# Patient Record
Sex: Male | Born: 1975
Health system: Southern US, Community
[De-identification: ages and names within clinical notes are randomized; demographics above are authoritative.]

## PROBLEM LIST (undated history)

## (undated) DIAGNOSIS — T7840XA Allergy, unspecified, initial encounter: Secondary | ICD-10-CM

## (undated) HISTORY — DX: Allergy, unspecified, initial encounter: T78.40XA

---

## 2015-08-09 ENCOUNTER — Ambulatory Visit (INDEPENDENT_AMBULATORY_CARE_PROVIDER_SITE_OTHER): Payer: 59 | Admitting: Family Medicine

## 2015-08-09 VITALS — BP 140/86 | HR 114 | Temp 98.2°F | Resp 20 | Ht 69.0 in | Wt 188.8 lb

## 2015-08-09 DIAGNOSIS — L723 Sebaceous cyst: Secondary | ICD-10-CM

## 2015-08-09 MED ORDER — DOXYCYCLINE HYCLATE 100 MG PO TABS: 100.0000 mg | ORAL_TABLET | Freq: Two times a day (BID) | ORAL | Status: DC

## 2015-08-09 NOTE — Patient Instructions (Addendum)
WOUND CARE Please return in 10 days to have your stitches/staples removed or sooner if you have concerns. Marland Kitchen Keep area clean and dry for 24 hours. Do not remove bandage, if applied. . After 24 hours, remove bandage and wash wound gently with mild soap and warm water. Reapply a new bandage after cleaning wound, if directed. . Continue daily cleansing with soap and water until stitches/staples are removed. . Do not apply any ointments or creams to the wound while stitches/staples are in place, as this may cause delayed healing. . Notify the office if you experience any of the following signs of infection: Swelling, redness, pus drainage, streaking, fever >101.0 F . Notify the office if you experience excessive bleeding that does not stop after 15-20 minutes of constant, firm pressure.    Epidermal Cyst An epidermal cyst is sometimes called a sebaceous cyst, epidermal inclusion cyst, or infundibular cyst. These cysts usually contain a substance that looks "pasty" or "cheesy" and may have a bad smell. This substance is a protein called keratin. Epidermal cysts are usually found on the face, neck, or trunk. They may also occur in the vaginal area or other parts of the genitalia of both men and women. Epidermal cysts are usually small, painless, slow-growing bumps or lumps that move freely under the skin. It is important not to try to pop them. This may cause an infection and lead to tenderness and swelling. CAUSES  Epidermal cysts may be caused by a deep penetrating injury to the skin or a plugged hair follicle, often associated with acne. SYMPTOMS  Epidermal cysts can become inflamed and cause:  Redness.  Tenderness.  Increased temperature of the skin over the bumps or lumps.  Grayish-white, bad smelling material that drains from the bump or lump. DIAGNOSIS  Epidermal cysts are easily diagnosed by your caregiver during an exam. Rarely, a tissue sample (biopsy) may be taken to rule out  other conditions that may resemble epidermal cysts. TREATMENT   Epidermal cysts often get better and disappear on their own. They are rarely ever cancerous.  If a cyst becomes infected, it may become inflamed and tender. This may require opening and draining the cyst. Treatment with antibiotics may be necessary. When the infection is gone, the cyst may be removed with minor surgery.  Small, inflamed cysts can often be treated with antibiotics or by injecting steroid medicines.  Sometimes, epidermal cysts become large and bothersome. If this happens, surgical removal in your caregiver's office may be necessary. HOME CARE INSTRUCTIONS  Only take over-the-counter or prescription medicines as directed by your caregiver.  Take your antibiotics as directed. Finish them even if you start to feel better. SEEK MEDICAL CARE IF:   Your cyst becomes tender, red, or swollen.  Your condition is not improving or is getting worse.  You have any other questions or concerns. MAKE SURE YOU:  Understand these instructions.  Will watch your condition.  Will get help right away if you are not doing well or get worse.   This information is not intended to replace advice given to you by your health care provider. Make sure you discuss any questions you have with your health care provider.   Document Released: 06/11/2004 Document Revised: 10/03/2011 Document Reviewed: 01/17/2011 Elsevier Interactive Patient Education 2016 Elsevier Inc.   Sebaceous Cyst Removal Sebaceous cyst removal is a procedure to remove a sac of oily material that forms under your skin (sebaceous cyst). Sebaceous cysts may also be called epidermoid cysts or keratin  cysts. Normally, the skin secretes this oily material through a gland or a hair follicle. This type of cyst usually results when a skin gland or hair follicle becomes blocked. You may need this procedure if you have a sebaceous cyst that becomes large, uncomfortable, or  infected. LET [facility] KNOW ABOUT:  Any allergies you have.  All medicines you are taking, including vitamins, herbs, eye drops, creams, and over-the-counter medicines.  Previous problems you or members of your family have had with the use of anesthetics.  Any blood disorders you have.  Previous surgeries you have had.  Medical conditions you have. RISKS AND COMPLICATIONS Generally, this is a safe procedure. However, problems may occur, including:  Developing another cyst.  Bleeding.  Infection.  Scarring. BEFORE THE PROCEDURE  Ask your health care provider about:  Changing or stopping your regular medicines. This is especially important if you are taking diabetes medicines or blood thinners.  Taking medicines such as aspirin and ibuprofen. These medicines can thin your blood. Do not take these medicines before your procedure if your health care provider instructs you not to.  If you have an infected cyst, you may have to take antibiotic medicines before or after the cyst removal. Take your antibiotics as directed by your health care provider. Finish all of the medicine even if you start to feel better.  Take a shower on the morning of your procedure. Your health care provider may ask you to use a germ-killing (antiseptic) soap. PROCEDURE  You will be given a medicine that numbs the area (local anesthetic).  The skin around the cyst will be cleaned with a germ-killing solution (antiseptic).  Your health care provider will make a small surgical incision over the cyst.  The cyst will be separated from the surrounding tissues that are under your skin.  If possible, the cyst will be removed undamaged (intact).  If the cyst bursts (ruptures), it will need to be removed in pieces.  After the cyst is removed, your health care provider will control any bleeding and close the incision with small stitches (sutures). Small incisions may not need sutures, and the  bleeding will be controlled by applying direct pressure with gauze.  Your health care provider may apply antibiotic ointment and a light bandage (dressing) over the incision. This procedure may vary among health care providers and hospitals. AFTER THE PROCEDURE  If your cyst ruptured during surgery, you may need to take antibiotic medicine. If you were prescribed an antibiotic medicine, finish all of it even if you start to feel better.   This information is not intended to replace advice given to you by your health care provider. Make sure you discuss any questions you have with your health care provider.   Document Released: 07/08/2000 Document Revised: 08/01/2014 Document Reviewed: 03/26/2014 Elsevier Interactive Patient Education [employer]2016 Elsevier Inc.

## 2015-08-09 NOTE — Progress Notes (Signed)
This chart was scribed for Elvina SidleKurt Gem Conkle, MD by [facility] Abu Hashem, medical scribe at [facility].The patient was seen in exam room 09 and the patient's care was started at 11:50 AM.  Patient ID: Keith Joseph MRN: [id number], DOB: 1975-07-28, 40 y.o. Date of Encounter: 08/09/2015  Primary Physician: No primary care provider on file.  Chief Complaint:  Chief Complaint  Patient presents with  . Cyst    upper back    HPI:  Keith Ahner is a 40 y.o. male who presents to [facility] complaining of cyst on his upper back which has been present for 16 days, however it has recently become more painful. Designer, industrial/productWarehouse manager.   Past Medical History  Diagnosis Date  . Allergy     Home Meds: Prior to Admission medications   Medication Sig Start Date End Date Taking? Authorizing Provider  aspirin 81 MG tablet Take 81 mg by mouth daily.   Yes Historical Provider, MD  fenofibrate 160 MG tablet Take 160 mg by mouth daily.   Yes Historical Provider, MD    Allergies: No Known Allergies  Social History   Social History  . Marital Status: Single    Spouse Name: N/A  . Number of Children: N/A  . Years of Education: N/A   Occupational History  . Not on file.   Social History Main Topics  . Smoking status: Current Every Day Smoker -- 1.00 packs/day    Types: Cigarettes  . Smokeless tobacco: Not on file  . Alcohol Use: 0.0 oz/week    0 Standard drinks or equivalent per week  . Drug Use: No  . Sexual Activity: Not on file   Other Topics Concern  . Not on file   Social History Narrative  . No narrative on file    Review of Systems: Constitutional: negative for chills, fever, night sweats, weight changes, or fatigue  HEENT: negative for vision changes, hearing loss, congestion, rhinorrhea, ST, epistaxis, or sinus pressure Cardiovascular: negative for chest pain or palpitations Respiratory: negative for hemoptysis, wheezing, shortness of breath, or  cough Abdominal: negative for abdominal pain, nausea, vomiting, diarrhea, or constipation Dermatological: negative for rash Neurologic: negative for headache, dizziness, or syncope All other systems reviewed and are otherwise negative with the exception to those above and in the HPI.  Physical Exam: Blood pressure 140/86, pulse 114, temperature 98.2 F (36.8 C), temperature source Oral, resp. rate 20, height 5\' 9"  (1.753 m), weight 188 lb 12.8 oz (85.639 kg), SpO2 98 %., Body mass index is 27.87 kg/(m^2). General: Well developed, well nourished, in no acute distress. Head: Normocephalic, atraumatic, eyes without discharge, sclera non-icteric, nares are without discharge. Bilateral auditory canals clear, TM's are without perforation, pearly grey and translucent with reflective cone of light bilaterally. Oral cavity moist, posterior pharynx without exudate, erythema, peritonsillar abscess, or post nasal drip.  Neck: Supple. No thyromegaly. Full ROM. No lymphadenopathy. Lungs: Clear bilaterally to auscultation without wheezes, rales, or rhonchi. Breathing is unlabored. Heart: RRR with S1 S2. No murmurs, rubs, or gallops appreciated. Abdomen: Soft, non-tender, non-distended with normoactive bowel sounds. No hepatomegaly. No rebound/guarding. No obvious abdominal masses. Msk:  Strength and tone normal for age. Extremities/Skin: Warm and dry. No clubbing or cyanosis. No edema. 1.5 cm tender right suprascapular mass consist with a sebaceous abscess  Neuro: Alert and oriented X 3. Moves all extremities spontaneously. Gait is normal. CNII-XII grossly in tact. Psych:  Responds to questions appropriately with a normal affect.  ASSESSMENT AND PLAN:  40 y.o. year old male with sebaceous cyst, infected.  By signing my name below, I, Nadim Abuhashem, attest that this documentation has been prepared under the direction and in the presence of Elvina Sidle, MD.  Electronically Signed: Conchita Paris,  medical scribe. 08/09/2015 11:56 AM.  This chart was scribed in my presence and reviewed by me personally.    ICD-9-CM ICD-10-CM   1. Sebaceous cyst 706.2 L72.3 doxycycline (VIBRA-TABS) 100 MG tablet     Signed, Elvina Sidle, MD

## 2015-08-09 NOTE — Progress Notes (Signed)
Procedure was performed by myself, Deliah BostonMichael Clark, PA-C and Leary RocaMichael Maczis, PA-S. Local anesthesia with 1 cc 2% lidocaine plain. Sterile prep and drape. Elliptical excision performed and dissection with curved hemostats down tot he cyst. The sac was quite adherent to the surrounding tissues and unfortunately was ruptured in attempt to dissect it. One the sebaceous material was evacuated, dissection continued with some difficulty. 2 cc additional 2% lidocaine was used to anesthetize the area.  Once the remaining sac was removed, the wound was closed with #1 4-0 Prolene horizontal mattress and #2 simple interrupted sutures. The area was then cleansed and dressed.

## 2015-08-19 ENCOUNTER — Ambulatory Visit (INDEPENDENT_AMBULATORY_CARE_PROVIDER_SITE_OTHER): Payer: 59 | Admitting: Physician Assistant

## 2015-08-19 VITALS — BP 132/76 | HR 104 | Temp 97.8°F | Resp 18 | Ht 69.0 in | Wt 185.0 lb

## 2015-08-19 DIAGNOSIS — M25511 Pain in right shoulder: Secondary | ICD-10-CM

## 2015-08-19 DIAGNOSIS — L723 Sebaceous cyst: Secondary | ICD-10-CM

## 2015-08-19 DIAGNOSIS — Z5189 Encounter for other specified aftercare: Secondary | ICD-10-CM

## 2015-08-19 MED ORDER — METHOCARBAMOL 500 MG PO TABS: 500.0000 mg | ORAL_TABLET | Freq: Four times a day (QID) | ORAL | Status: AC

## 2015-08-19 MED ORDER — DICLOFENAC SODIUM 75 MG PO TBEC
75.0000 mg | DELAYED_RELEASE_TABLET | Freq: Two times a day (BID) | ORAL | Status: AC
Start: 2015-08-19 — End: ?

## 2015-08-19 NOTE — Progress Notes (Signed)
   Keith Joseph  MRN: [id number] DOB: 16-May-1976  Subjective:  Pt presents to clinic for wound recheck and suture removal.  The area is still painful and he will intermittently get sharp pain down the back of his right arm that seems to be associated with movements.  This pain was slightly present prior to the I&D procedure and it has not changed since then.  He has been using some biofreeze on the area of the intact skin that has helped a little at night which is when it is the worse.  He has had some small bleeding at night on his sheets.  There are no active problems to display for this patient.   Current Outpatient Prescriptions on File Prior to Visit  Medication Sig Dispense Refill  . aspirin 81 MG tablet Take 81 mg by mouth daily.    . fenofibrate 160 MG tablet Take 160 mg by mouth daily.     No current facility-administered medications on file prior to visit.    No Known Allergies  Review of Systems  Constitutional: Negative for fever and chills.  Skin: Positive for wound.   Objective:  BP 132/76 mmHg  Pulse 104  Temp(Src) 97.8 F (36.6 C) (Oral)  Resp 18  Ht  (1.753 m)  Wt 185 lb (83.915 kg)  BMI 27.31 kg/m2  SpO2 98%  Physical Exam  Constitutional: He is oriented to person, place, and time and well-developed, well-nourished, and in no distress.  HENT:  Head: Normocephalic and atraumatic.  Right Ear: External ear normal.  Left Ear: External ear normal.  Eyes: Conjunctivae are normal.  Neck: Normal range of motion.  Pulmonary/Chest: Effort normal.  Neurological: He is alert and oriented to person, place, and time. Gait normal.  Skin: Skin is warm and dry.     Psychiatric: Mood, memory, affect and judgment normal.    Assessment and Plan :  Sebaceous cyst - Plan: Apply dressing, Care order/instruction  Encounter for wound re-check  Pain in joint of right shoulder - Plan: methocarbamol (ROBAXIN) 500 MG tablet, diclofenac (VOLTAREN) 75 MG EC tablet    Wound care d/w pt - I suspect that the patient is having some muscle spasms that likely started before the I&D was performed - heat to the area - keep a drsg placed on the area - recheck if things are getting worse  Benny Lennert PA-C  [facility] 08/19/2015 11:54 AM

## 2015-08-19 NOTE — Patient Instructions (Signed)
Keep drsg covered.  Take the medications for the next 5 days and continue if they are helping but stop if they are not.  I suspect that the pain you are feeling is related to a muscle spasm that should over the next 10 days improve and resolve.  Try heat on the area to help with increased blood flow to the muscle.

## 2017-05-12 DIAGNOSIS — J309 Allergic rhinitis, unspecified: Secondary | ICD-10-CM | POA: Diagnosis not present

## 2017-05-12 DIAGNOSIS — E781 Pure hyperglyceridemia: Secondary | ICD-10-CM | POA: Diagnosis not present

## 2017-05-12 DIAGNOSIS — Z0000 Encounter for general adult medical examination without abnormal findings: Secondary | ICD-10-CM | POA: Diagnosis not present

## 2017-05-12 DIAGNOSIS — E782 Mixed hyperlipidemia: Secondary | ICD-10-CM | POA: Diagnosis not present

## 2017-05-12 DIAGNOSIS — Z823 Family history of stroke: Secondary | ICD-10-CM | POA: Diagnosis not present

## 2017-06-09 DIAGNOSIS — E782 Mixed hyperlipidemia: Secondary | ICD-10-CM | POA: Diagnosis not present

## 2017-08-16 ENCOUNTER — Ambulatory Visit (INDEPENDENT_AMBULATORY_CARE_PROVIDER_SITE_OTHER): Payer: Worker's Compensation

## 2017-08-16 ENCOUNTER — Other Ambulatory Visit: Payer: Self-pay

## 2017-08-16 ENCOUNTER — Ambulatory Visit (INDEPENDENT_AMBULATORY_CARE_PROVIDER_SITE_OTHER): Payer: Worker's Compensation | Admitting: Physician Assistant

## 2017-08-16 ENCOUNTER — Encounter: Payer: Self-pay | Admitting: Physician Assistant

## 2017-08-16 VITALS — BP 146/90 | HR 96 | Resp 16

## 2017-08-16 DIAGNOSIS — M7989 Other specified soft tissue disorders: Secondary | ICD-10-CM | POA: Diagnosis not present

## 2017-08-16 DIAGNOSIS — S99921A Unspecified injury of right foot, initial encounter: Secondary | ICD-10-CM

## 2017-08-16 MED ORDER — MELOXICAM 15 MG PO TABS: 7.5000 mg | ORAL_TABLET | Freq: Every day | ORAL | 0 refills | Status: AC

## 2017-08-16 NOTE — Progress Notes (Signed)
    08/16/2017 5:10 PM   DOB: 1976-07-24 / MRN: [id number]  SUBJECTIVE:  Keith Joseph is a 42 y.o. male presenting for right foot swelling.  Tells me that his 70 pound door landed on his foot at work today.  He denies pain.  He is ambulating without difficulty.   Review of Systems  Constitutional: Negative for chills, diaphoresis and fever.  Skin: Negative for rash.  Neurological: Negative for dizziness.    The problem list and medications were reviewed and updated by myself where necessary and exist elsewhere in the encounter.   OBJECTIVE:  BP (!) 146/90 (BP Location: Left Arm, Patient Position: Sitting, Cuff Size: Large)   Pulse 96   Resp 16   SpO2 99%   Physical Exam  Cardiovascular: Normal rate.  Pulmonary/Chest: Effort normal.  Musculoskeletal:       Feet:    No results found for this or any previous visit (from the past 72 hour(s)).  Dg Foot Complete Right  Result Date: 08/16/2017 CLINICAL DATA:  Right foot injury at work EXAM: RIGHT FOOT COMPLETE - 3+ VIEW COMPARISON:  None. FINDINGS: No fracture dislocation. 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 Small plantar calcaneal spur. Severe soft tissue swelling over the dorsal aspect of the metatarsals. IMPRESSION: 1.  No acute osseous injury of the right foot. 2. Severe soft tissue swelling along the dorsal aspect of the metatarsals. Electronically Signed   By: Elige KoHetal  Patel   On: 08/16/2017 17:05    ASSESSMENT AND PLAN:  Keith was seen today for foot injury.  Diagnoses and all orders for this visit:  Injury of right foot, initial encounter -     DG Foot Complete Right    The patient is advised to call or return to clinic if he does not see an improvement in symptoms, or to seek the care of the closest emergency department if he worsens with the above plan.   Deliah BostonMichael Clark, MHS, PA-C [facility] at [facility] 08/16/2017 5:10 PM

## 2017-08-16 NOTE — Patient Instructions (Addendum)
  Please ice and elevate the leg tonight as much as possible.  I would suggest that leaving the Ace wrap on except for bathing for the next 5 or 6 days.  Given that you are largely asymptomatic I am advising that you return to work without restriction for the next available shift.   IF you received an x-ray today, you will receive an invoice from [facility]. Please contact [facility] at [phone] with questions or concerns regarding your invoice.   IF you received labwork today, you will receive an invoice from [facility]. Please contact [facility] at [phone] with questions or concerns regarding your invoice.   Our billing staff will not be able to assist you with questions regarding bills from these companies.  You will be contacted with the lab results as soon as they are available. The fastest way to get your results is to activate your My Chart account. Instructions are located on the last page of this paperwork. If you have not heard from us regarding the results in 2 weeks, please contact this office.

## 2017-12-08 DIAGNOSIS — R7989 Other specified abnormal findings of blood chemistry: Secondary | ICD-10-CM | POA: Diagnosis not present

## 2018-05-25 DIAGNOSIS — D696 Thrombocytopenia, unspecified: Secondary | ICD-10-CM | POA: Diagnosis not present

## 2018-05-25 DIAGNOSIS — E781 Pure hyperglyceridemia: Secondary | ICD-10-CM | POA: Diagnosis not present

## 2018-05-25 DIAGNOSIS — Z0000 Encounter for general adult medical examination without abnormal findings: Secondary | ICD-10-CM | POA: Diagnosis not present

## 2018-10-30 IMAGING — DX DG FOOT COMPLETE 3+V*R*
3 series · 3 of 3 positions shown · non-contrast
Comparison: None.

CLINICAL DATA: Right foot injury at work

EXAM:
RIGHT FOOT COMPLETE - 3+ VIEW

[foot ap]
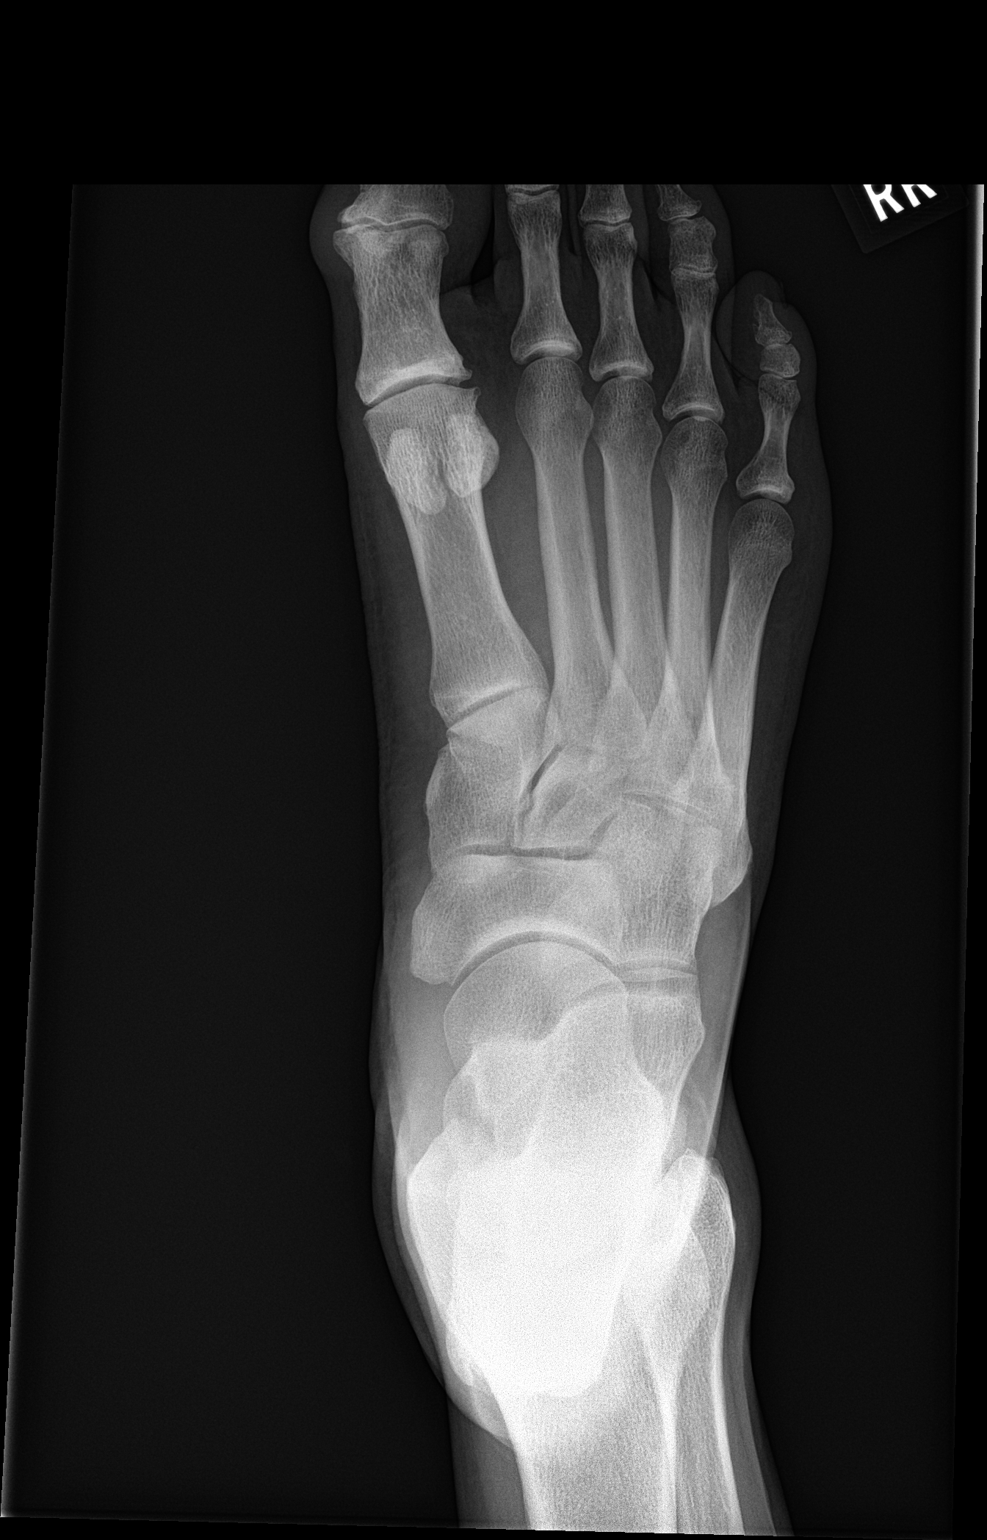

[foot obl]
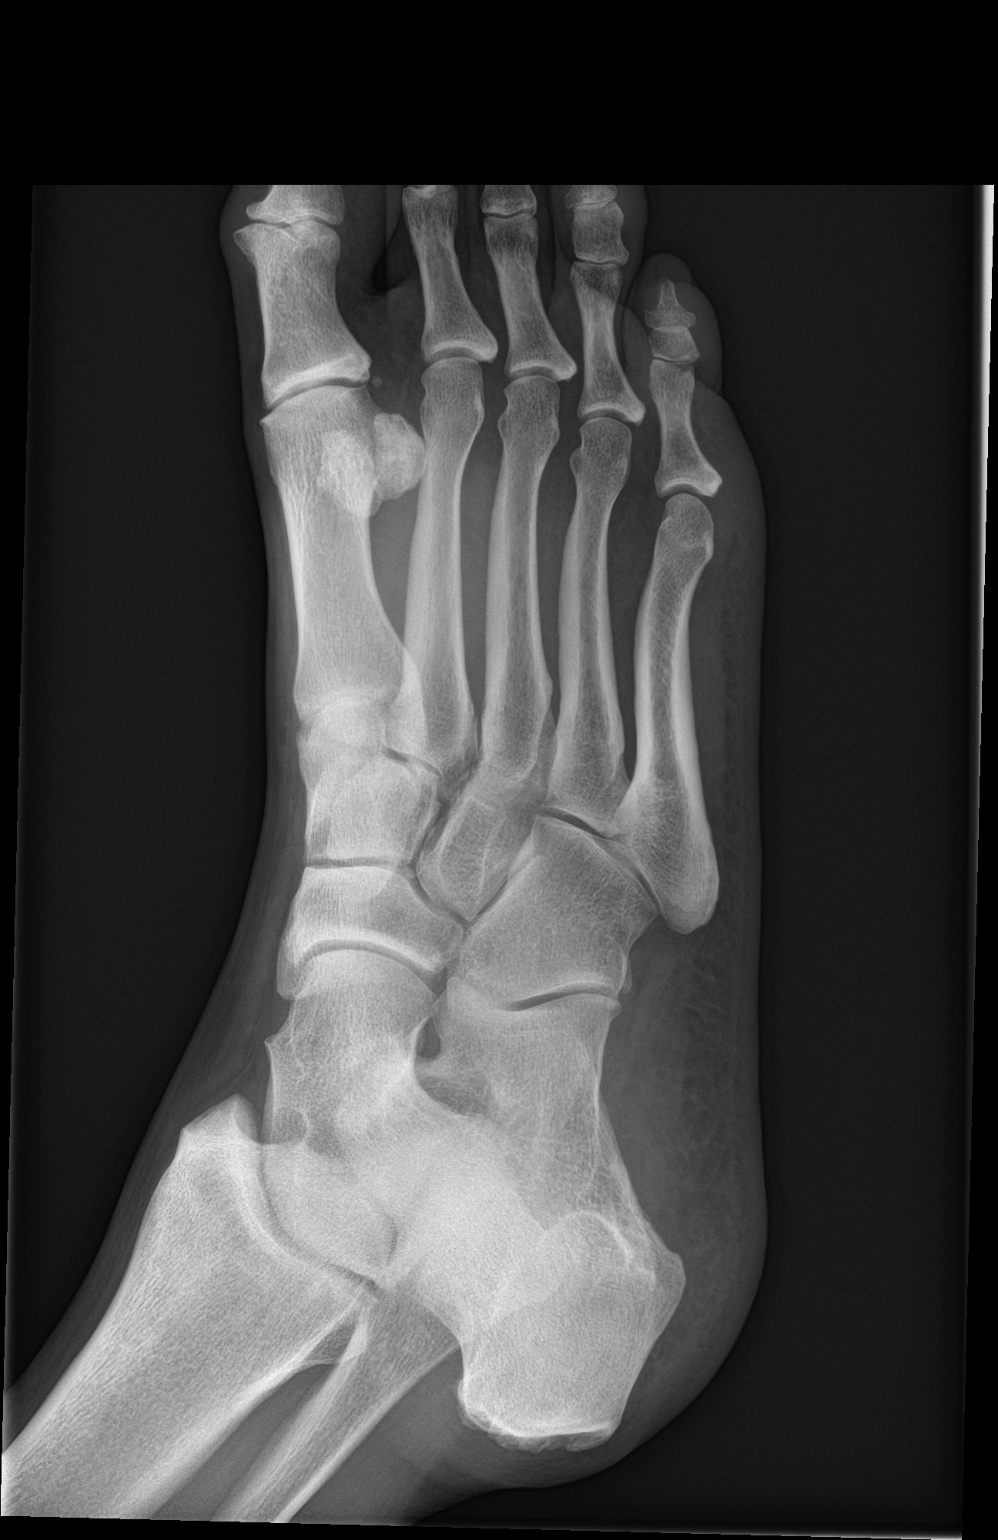

[foot lat]
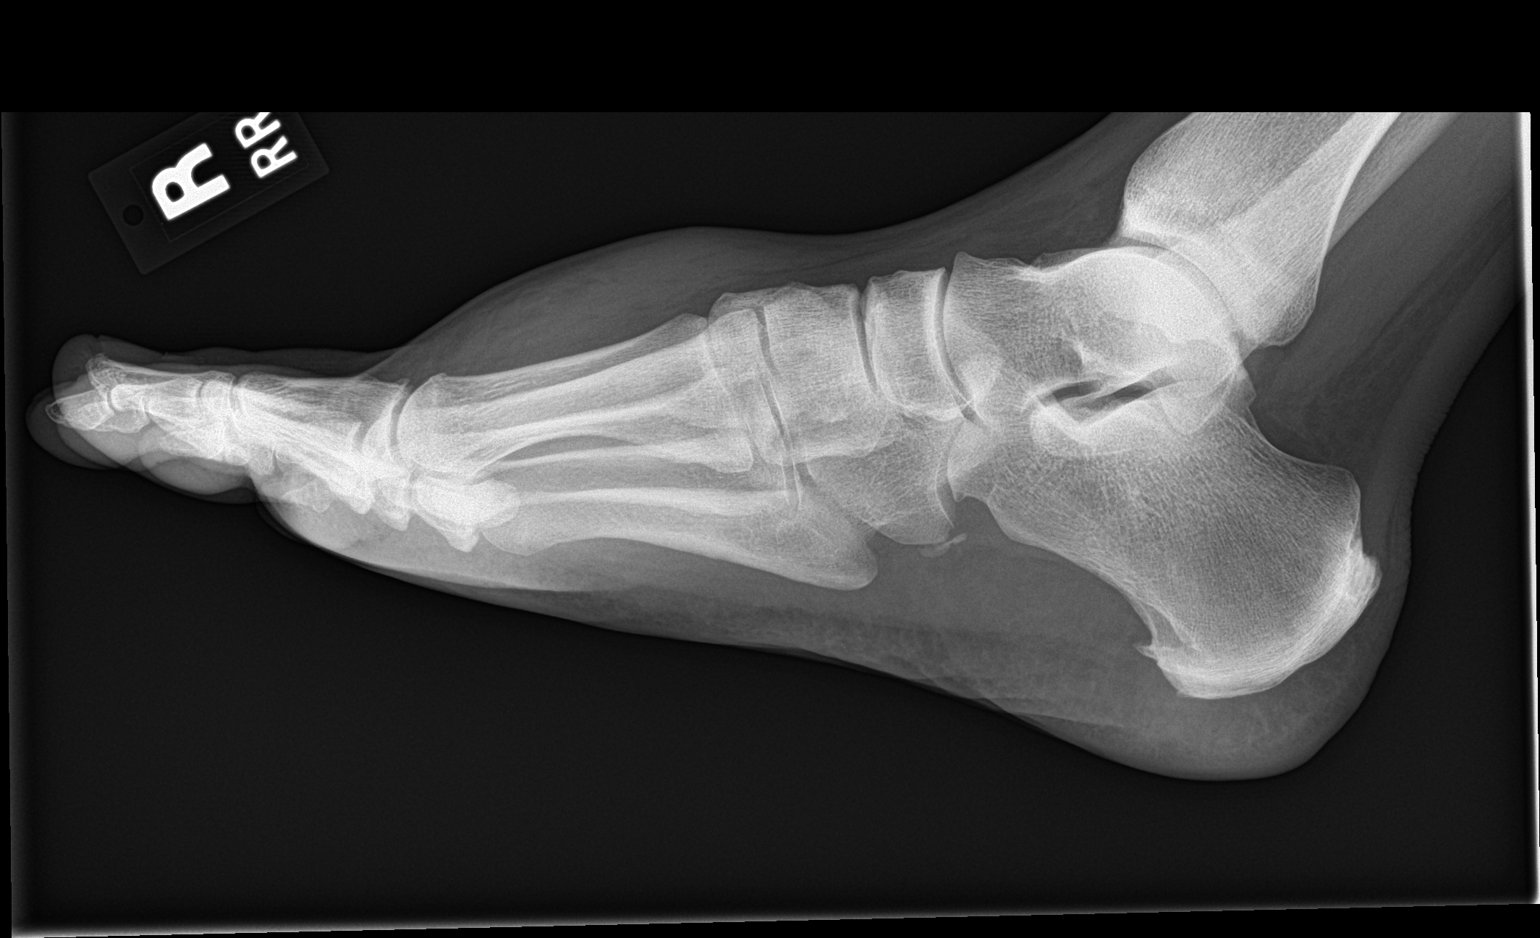

[3 of 3 positions shown; findings below may reference images not displayed]

FINDINGS: No fracture dislocation. Mild osteoarthritis of the first MTP joint
and first IP joint. Small plantar calcaneal spur. Severe soft tissue
swelling over the dorsal aspect of the metatarsals.
IMPRESSION: 1.  No acute osseous injury of the right foot.
2. Severe soft tissue swelling along the dorsal aspect of the
metatarsals.

## 2019-05-13 ENCOUNTER — Other Ambulatory Visit: Payer: Self-pay

## 2019-05-13 DIAGNOSIS — Z20822 Contact with and (suspected) exposure to covid-19: Secondary | ICD-10-CM

## 2019-05-15 LAB — NOVEL CORONAVIRUS, NAA: SARS-CoV-2, NAA: NOT DETECTED

## 2020-06-26 DIAGNOSIS — E781 Pure hyperglyceridemia: Secondary | ICD-10-CM | POA: Diagnosis not present

## 2020-06-26 DIAGNOSIS — Z0000 Encounter for general adult medical examination without abnormal findings: Secondary | ICD-10-CM | POA: Diagnosis not present

## 2020-06-26 DIAGNOSIS — R7303 Prediabetes: Secondary | ICD-10-CM | POA: Diagnosis not present

## 2020-06-26 DIAGNOSIS — D696 Thrombocytopenia, unspecified: Secondary | ICD-10-CM | POA: Diagnosis not present

## 2020-06-26 DIAGNOSIS — R7301 Impaired fasting glucose: Secondary | ICD-10-CM | POA: Diagnosis not present

## 2021-07-02 DIAGNOSIS — E781 Pure hyperglyceridemia: Secondary | ICD-10-CM | POA: Diagnosis not present

## 2021-07-02 DIAGNOSIS — R7303 Prediabetes: Secondary | ICD-10-CM | POA: Diagnosis not present

## 2021-07-02 DIAGNOSIS — Z0000 Encounter for general adult medical examination without abnormal findings: Secondary | ICD-10-CM | POA: Diagnosis not present

## 2021-07-02 DIAGNOSIS — Z13220 Encounter for screening for lipoid disorders: Secondary | ICD-10-CM | POA: Diagnosis not present

## 2022-07-08 DIAGNOSIS — R7303 Prediabetes: Secondary | ICD-10-CM | POA: Diagnosis not present

## 2022-07-08 DIAGNOSIS — I10 Essential (primary) hypertension: Secondary | ICD-10-CM | POA: Diagnosis not present

## 2022-07-08 DIAGNOSIS — E781 Pure hyperglyceridemia: Secondary | ICD-10-CM | POA: Diagnosis not present

## 2022-07-08 DIAGNOSIS — Z0000 Encounter for general adult medical examination without abnormal findings: Secondary | ICD-10-CM | POA: Diagnosis not present

## 2022-07-08 DIAGNOSIS — Z23 Encounter for immunization: Secondary | ICD-10-CM | POA: Diagnosis not present

## 2022-09-02 DIAGNOSIS — J322 Chronic ethmoidal sinusitis: Secondary | ICD-10-CM | POA: Diagnosis not present
